# Patient Record
Sex: Male | Born: 1995 | Race: White | Hispanic: No | Marital: Single | Smoking: Former smoker
Health system: Southern US, Community
[De-identification: ages and names within clinical notes are randomized; demographics above are authoritative.]

## PROBLEM LIST (undated history)

## (undated) DIAGNOSIS — S060X9A Concussion with loss of consciousness of unspecified duration, initial encounter: Secondary | ICD-10-CM

## (undated) DIAGNOSIS — S060XAA Concussion with loss of consciousness status unknown, initial encounter: Secondary | ICD-10-CM

---
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
## 2019-12-09 ENCOUNTER — Ambulatory Visit
Admission: EM | Admit: 2019-12-09 | Discharge: 2019-12-09 | Disposition: A | Discharge status: Home / Self Care | Payer: Self-pay | Attending: Physician Assistant | Admitting: Physician Assistant

## 2019-12-09 ENCOUNTER — Other Ambulatory Visit: Payer: Self-pay

## 2019-12-09 ENCOUNTER — Encounter: Payer: Self-pay | Admitting: Emergency Medicine

## 2019-12-09 DIAGNOSIS — B349 Viral infection, unspecified: Secondary | ICD-10-CM

## 2019-12-09 DIAGNOSIS — Z87891 Personal history of nicotine dependence: Secondary | ICD-10-CM | POA: Insufficient documentation

## 2019-12-09 DIAGNOSIS — R197 Diarrhea, unspecified: Secondary | ICD-10-CM | POA: Insufficient documentation

## 2019-12-09 DIAGNOSIS — R112 Nausea with vomiting, unspecified: Secondary | ICD-10-CM | POA: Insufficient documentation

## 2019-12-09 DIAGNOSIS — Z20822 Contact with and (suspected) exposure to covid-19: Secondary | ICD-10-CM | POA: Insufficient documentation

## 2019-12-09 LAB — GROUP A STREP BY PCR: Group A Strep by PCR: NOT DETECTED

## 2019-12-09 MED ORDER — PROMETHAZINE HCL 25 MG RE SUPP: 25.0000 mg | Freq: Four times a day (QID) | RECTAL | 0 refills | Status: DC | PRN

## 2019-12-09 MED ORDER — ONDANSETRON 4 MG PO TBDP
4.0000 mg | ORAL_TABLET | Freq: Once | ORAL | Status: AC
  Administered 2019-12-09: 4 mg via ORAL

## 2019-12-09 NOTE — ED Provider Notes (Signed)
[facility]    CSN: [id number] Arrival date & time: 12/09/19  1403      History   Chief Complaint Chief Complaint  Patient presents with  . Emesis  . Diarrhea  . Sore Throat    HPI Charles Gates is a 24 y.o. male.   The history is provided by the patient. No language interpreter was used.  Emesis Severity:  Moderate Duration:  1 day Timing:  Constant Quality:  Stomach contents Able to tolerate:  Liquids Progression:  Unchanged Chronicity:  New Recent urination:  Normal Context: not post-tussive and not self-induced   Relieved by:  Nothing Worsened by:  Nothing Ineffective treatments:  None tried Associated symptoms: myalgias and sore throat   Associated symptoms: no abdominal pain, no fever and no headaches   Risk factors: no alcohol use, no diabetes, not pregnant, no prior abdominal surgery, no sick contacts, no suspect food intake and no travel to endemic areas     History reviewed. No pertinent past medical history.  Patient Active Problem List   Diagnosis Date Noted  . Nausea vomiting and diarrhea 12/09/2019  . Nonspecific syndrome suggestive of viral illness 12/09/2019    Past Surgical History:  Procedure Laterality Date  . WISDOM TOOTH EXTRACTION         Home Medications    Prior to Admission medications   Medication Sig Start Date End Date Taking? Authorizing Provider  promethazine (PHENERGAN) 25 MG suppository Place 1 suppository (25 mg total) rectally every 6 (six) hours as needed for nausea or vomiting. 12/09/19   Yulonda Wheeling, Para March, NP    Family History Family History  Problem Relation Age of Onset  . Healthy Mother   . Lung cancer Father     Social History Social History   Tobacco Use  . Smoking status: Former Smoker    Quit date: 12/09/2018    Years since quitting: 1.0  . Smokeless tobacco: Former Neurosurgeon    Quit date: 12/09/2018  Vaping Use  . Vaping Use: Never used  Substance Use Topics  . Alcohol use: Yes     Comment: rare  . Drug use: Never     Allergies   Gluten meal   Review of Systems Review of Systems  Constitutional: Positive for appetite change. Negative for fever.  HENT: Positive for sore throat.   Eyes: Negative.   Respiratory: Negative for shortness of breath.   Cardiovascular: Negative for chest pain.  Gastrointestinal: Negative for abdominal pain.  Endocrine: Negative.   Genitourinary: Negative.   Musculoskeletal: Positive for myalgias.  Skin: Negative for rash.  Allergic/Immunologic: Negative.   Neurological: Negative for headaches.  Hematological: Negative.   Psychiatric/Behavioral: Negative.   All other systems reviewed and are negative.    Physical Exam Triage Vital Signs ED Triage Vitals  Enc Vitals Group     BP 12/09/19 1438 111/77     Pulse Rate 12/09/19 1438 83     Resp 12/09/19 1438 18     Temp 12/09/19 1438 98.6 F (37 C)     Temp Source 12/09/19 1438 Oral     SpO2 12/09/19 1438 99 %     Weight --      Height --      Head Circumference --      Peak Flow --      Pain Score 12/09/19 1437 5     Pain Loc --      Pain Edu? --      Excl. in GC? --  No data found.  Updated Vital Signs BP 111/77 (BP Location: Left Arm)   Pulse 83   Temp 98.6 F (37 C) (Oral)   Resp 18   SpO2 99%    Physical Exam Vitals and nursing note reviewed.  Constitutional:      General: He is not in acute distress.    Appearance: He is well-developed. He is not ill-appearing.  HENT:     Head: Normocephalic.     Jaw: No trismus.     Right Ear: Tympanic membrane normal. No hemotympanum.     Left Ear: Tympanic membrane normal. No hemotympanum.     Nose: Mucosal edema and congestion present. No nasal deformity or septal deviation.     Mouth/Throat:     Mouth: Mucous membranes are moist.     Pharynx: Uvula midline. Posterior oropharyngeal erythema present. No oropharyngeal exudate or uvula swelling.     Tonsils: No tonsillar abscesses.  Eyes:     General: Lids  are normal.        Right eye: No discharge.        Left eye: No discharge.     Conjunctiva/sclera: Conjunctivae normal.     Right eye: Right conjunctiva is not injected. No hemorrhage.    Left eye: Left conjunctiva is not injected. No hemorrhage.    Pupils: Pupils are equal, round, and reactive to light. Pupils are equal.     Right eye: Pupil is round and reactive.     Left eye: Pupil is round and reactive.  Neck:     Trachea: Trachea normal. No tracheal deviation.     Meningeal: Brudzinski's sign and Kernig's sign absent.  Cardiovascular:     Rate and Rhythm: Normal rate and regular rhythm.     Pulses: Normal pulses.     Heart sounds: Normal heart sounds.  Pulmonary:     Effort: Pulmonary effort is normal.     Breath sounds: Normal breath sounds.  Abdominal:     General: Bowel sounds are normal.     Palpations: Abdomen is soft.     Tenderness: There is no abdominal tenderness.  Musculoskeletal:        General: Normal range of motion.     Cervical back: Normal range of motion.  Lymphadenopathy:     Cervical: No cervical adenopathy.  Skin:    General: Skin is warm and dry.  Neurological:     General: No focal deficit present.     Mental Status: He is alert and oriented to person, place, and time.     GCS: GCS eye subscore is 4. GCS verbal subscore is 5. GCS motor subscore is 6.     Cranial Nerves: No cranial nerve deficit.     Sensory: No sensory deficit.  Psychiatric:        Attention and Perception: Attention normal.        Mood and Affect: Mood normal.        Speech: Speech normal.        Behavior: Behavior is cooperative.      UC Treatments / Results  Labs (all labs ordered are listed, but only abnormal results are displayed) Labs Reviewed  GROUP A STREP BY PCR  SARS CORONAVIRUS 2 (TAT 6-24 HRS)    EKG   Radiology No results found.  Procedures Procedures (including critical care time)  Medications Ordered in UC Medications  ondansetron (ZOFRAN-ODT)  disintegrating tablet 4 mg (4 mg Oral Given 12/09/19 1454)    Initial Impression /  Assessment and Plan / UC Course  I have reviewed the triage vital signs and the nursing notes.  Pertinent labs & imaging results that were available during my care of the patient were reviewed by me and considered in my medical decision making (see chart for details).     Strep test negative, covid pending, discussed quarantine precautions w pt regarding covid. Able to take po's, recommend pushing fluids, rest. Will script phenergan suppositories. Work note given. Most likely this is viral illness no abx indicated.  DDX: gastroenteritis, viral illness, possible covid expsoure. Final Clinical Impressions(s) / UC Diagnoses   Final diagnoses:  Nausea vomiting and diarrhea  Nonspecific syndrome suggestive of viral illness     Discharge Instructions     Rest,push fluids, take phenergan as directed. Your covid test is pending, quarantine until resulted.     ED Prescriptions    Medication Sig Dispense Auth. Provider   promethazine (PHENERGAN) 25 MG suppository Place 1 suppository (25 mg total) rectally every 6 (six) hours as needed for nausea or vomiting. 12 each Caia Lofaro, Para March, NP     PDMP not reviewed this encounter.   Clancy Gourd, NP 12/09/19 [phone]

## 2019-12-09 NOTE — ED Triage Notes (Signed)
Patient in today c/o emesis, diarrhea, sore throat and headache since yesterday. Patient took an at home covid test yesterday and it was negative. Patient has not had the covid vaccine.

## 2019-12-09 NOTE — Discharge Instructions (Addendum)
Rest,push fluids, take phenergan as directed. Your covid test is pending, quarantine until resulted.

## 2019-12-10 LAB — SARS CORONAVIRUS 2 (TAT 6-24 HRS): SARS Coronavirus 2: NEGATIVE

## 2019-12-22 ENCOUNTER — Ambulatory Visit
Admission: RE | Admit: 2019-12-22 | Discharge: 2019-12-22 | Disposition: A | Discharge status: Home / Self Care | Payer: No Typology Code available for payment source | Attending: Physician Assistant | Admitting: Physician Assistant

## 2019-12-22 ENCOUNTER — Other Ambulatory Visit: Payer: Self-pay | Admitting: Physician Assistant

## 2019-12-22 ENCOUNTER — Ambulatory Visit
Admission: RE | Admit: 2019-12-22 | Discharge: 2019-12-22 | Disposition: A | Discharge status: Home / Self Care | Payer: No Typology Code available for payment source | Source: Ambulatory Visit | Attending: Physician Assistant | Admitting: Physician Assistant

## 2019-12-22 DIAGNOSIS — S4991XA Unspecified injury of right shoulder and upper arm, initial encounter: Secondary | ICD-10-CM | POA: Diagnosis not present

## 2020-04-12 ENCOUNTER — Ambulatory Visit
Admission: EM | Admit: 2020-04-12 | Discharge: 2020-04-12 | Disposition: A | Discharge status: Home / Self Care | Payer: Medicaid Other

## 2020-04-12 ENCOUNTER — Other Ambulatory Visit: Payer: Self-pay

## 2020-04-12 ENCOUNTER — Encounter: Payer: Self-pay | Admitting: Emergency Medicine

## 2020-04-12 DIAGNOSIS — G44319 Acute post-traumatic headache, not intractable: Secondary | ICD-10-CM

## 2020-04-12 DIAGNOSIS — R112 Nausea with vomiting, unspecified: Secondary | ICD-10-CM

## 2020-04-12 DIAGNOSIS — S069X9A Unspecified intracranial injury with loss of consciousness of unspecified duration, initial encounter: Secondary | ICD-10-CM

## 2020-04-12 DIAGNOSIS — H539 Unspecified visual disturbance: Secondary | ICD-10-CM

## 2020-04-12 HISTORY — DX: Concussion with loss of consciousness of unspecified duration, initial encounter: S06.0X9A

## 2020-04-12 HISTORY — DX: Concussion with loss of consciousness status unknown, initial encounter: S06.0XAA

## 2020-04-12 NOTE — Discharge Instructions (Addendum)
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
## 2020-04-19 ENCOUNTER — Ambulatory Visit
Admission: EM | Admit: 2020-04-19 | Discharge: 2020-04-19 | Disposition: A | Discharge status: Home / Self Care | Payer: Medicaid Other

## 2020-04-19 ENCOUNTER — Other Ambulatory Visit: Payer: Self-pay

## 2020-04-19 DIAGNOSIS — S0990XD Unspecified injury of head, subsequent encounter: Secondary | ICD-10-CM

## 2020-04-19 NOTE — Discharge Instructions (Addendum)
Your physical exam and neurological exam today were completely normal.  You are cleared to return to work.

## 2020-04-19 NOTE — ED Provider Notes (Signed)
[facility]    CSN: [id number] Arrival date & time: 04/19/20  1041      History   Chief Complaint Chief Complaint  Patient presents with  . Head Injury    HPI Charles Gates is a 25 y.o. male.   HPI   25 year old male here for concussion clearance to return to work.  Patient was evaluated in this urgent care on 04/12/2020 for a head injury with loss of consciousness and referred to the emergency department.  Patient was evaluated in the emergency department, had a head CT scan, and was discharged home.  His head CT showed no acute intracranial abnormality.  Patient reports that he is no gravity headaches, no vision changes, no numbness, tingling, or weakness, and that he feels ready to return to work.  Past Medical History:  Diagnosis Date  . Concussion     Patient Active Problem List   Diagnosis Date Noted  . Nausea vomiting and diarrhea 12/09/2019  . Nonspecific syndrome suggestive of viral illness 12/09/2019    Past Surgical History:  Procedure Laterality Date  . WISDOM TOOTH EXTRACTION         Home Medications    Prior to Admission medications   Medication Sig Start Date End Date Taking? Authorizing Provider  promethazine (PHENERGAN) 25 MG suppository Place 1 suppository (25 mg total) rectally every 6 (six) hours as needed for nausea or vomiting. 12/09/19 04/19/20  Defelice, Para March, NP    Family History Family History  Problem Relation Age of Onset  . Healthy Mother   . Lung cancer Father     Social History Social History   Tobacco Use  . Smoking status: Former Smoker    Quit date: 12/09/2018    Years since quitting: 1.3  . Smokeless tobacco: Former Neurosurgeon    Quit date: 12/09/2018  Vaping Use  . Vaping Use: Never used  Substance Use Topics  . Alcohol use: Yes    Comment: rare  . Drug use: Never     Allergies   Gluten meal   Review of Systems Review of Systems  Constitutional: Negative for activity change, appetite change  and fatigue.  Neurological: Negative for dizziness, weakness and headaches.  Hematological: Negative.   Psychiatric/Behavioral: Negative.      Physical Exam Triage Vital Signs ED Triage Vitals  Enc Vitals Group     BP 04/19/20 1107 132/86     Pulse Rate 04/19/20 1107 76     Resp 04/19/20 1107 18     Temp 04/19/20 1107 98.4 F (36.9 C)     Temp Source 04/19/20 1107 Oral     SpO2 04/19/20 1107 98 %     Weight 04/19/20 1106 195 lb 1.7 oz (88.5 kg)     Height 04/19/20 1106 5\' 10"  (1.778 m)     Head Circumference --      Peak Flow --      Pain Score 04/19/20 1106 0     Pain Loc --      Pain Edu? --      Excl. in GC? --    No data found.  Updated Vital Signs BP 132/86 (BP Location: Right Arm)   Pulse 76   Temp 98.4 F (36.9 C) (Oral)   Resp 18   Ht 5\' 10"  (1.778 m)   Wt 195 lb 1.7 oz (88.5 kg)   SpO2 98%   BMI 27.99 kg/m   Visual Acuity Right Eye Distance:   Left Eye Distance:  Bilateral Distance:    Right Eye Near:   Left Eye Near:    Bilateral Near:     Physical Exam Vitals and nursing note reviewed.  Constitutional:      General: He is not in acute distress.    Appearance: Normal appearance. He is not ill-appearing.  HENT:     Head: Normocephalic and atraumatic.     Mouth/Throat:     Mouth: Mucous membranes are moist.     Pharynx: Oropharynx is clear. No oropharyngeal exudate or posterior oropharyngeal erythema.  Eyes:     General: No scleral icterus.    Extraocular Movements: Extraocular movements intact.     Conjunctiva/sclera: Conjunctivae normal.     Pupils: Pupils are equal, round, and reactive to light.  Cardiovascular:     Rate and Rhythm: Normal rate and regular rhythm.     Pulses: Normal pulses.     Heart sounds: No murmur heard. No gallop.   Pulmonary:     Effort: Pulmonary effort is normal.     Breath sounds: Normal breath sounds. No wheezing, rhonchi or rales.  Musculoskeletal:        General: No swelling or tenderness. Normal range  of motion.     Cervical back: Normal range of motion and neck supple.  Lymphadenopathy:     Cervical: No cervical adenopathy.  Skin:    General: Skin is warm and dry.     Capillary Refill: Capillary refill takes less than 2 seconds.     Findings: No erythema or rash.  Neurological:     General: No focal deficit present.     Mental Status: He is alert and oriented to person, place, and time.     Cranial Nerves: No cranial nerve deficit.     Sensory: No sensory deficit.     Coordination: Coordination normal.     Deep Tendon Reflexes: Reflexes normal.  Psychiatric:        Mood and Affect: Mood normal.        Behavior: Behavior normal.        Thought Content: Thought content normal.        Judgment: Judgment normal.      UC Treatments / Results  Labs (all labs ordered are listed, but only abnormal results are displayed) Labs Reviewed - No data to display  EKG   Radiology No results found.  Procedures Procedures (including critical care time)  Medications Ordered in UC Medications - No data to display  Initial Impression / Assessment and Plan / UC Course  I have reviewed the triage vital signs and the nursing notes.  Pertinent labs & imaging results that were available during my care of the patient were reviewed by me and considered in my medical decision making (see chart for details).   Is here for evaluation and clearance to return to work status post concussion.  Patient is in no acute distress.  Physical exam reveals no abnormalities.  Neuro exam reveals intact cranial nerves II through XII, DTRs are globally 1+, grips, upper extremities, and lower extremity strength are all 5/5.  Patient has no lingering neurological symptoms from his concussion.  Will clear patient to return to work.   Final Clinical Impressions(s) / UC Diagnoses   Final diagnoses:  Closed head injury, subsequent encounter     Discharge Instructions     Your physical exam and neurological  exam today were completely normal.  You are cleared to return to work.    ED Prescriptions  None     PDMP not reviewed this encounter.   Becky Augusta, NP 04/19/20 1130
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
## 2020-12-07 IMAGING — CR DG SHOULDER 2+V*R*
1 series · 4 of 4 positions shown · non-contrast
Comparison: None.

CLINICAL DATA: 23-year-old male with right shoulder injury.

EXAM:
RIGHT SHOULDER - 2+ VIEW

[Series 1: dg shoulder right · 0.14mm/px · 4 of 4 slices shown]
[im 1/4]
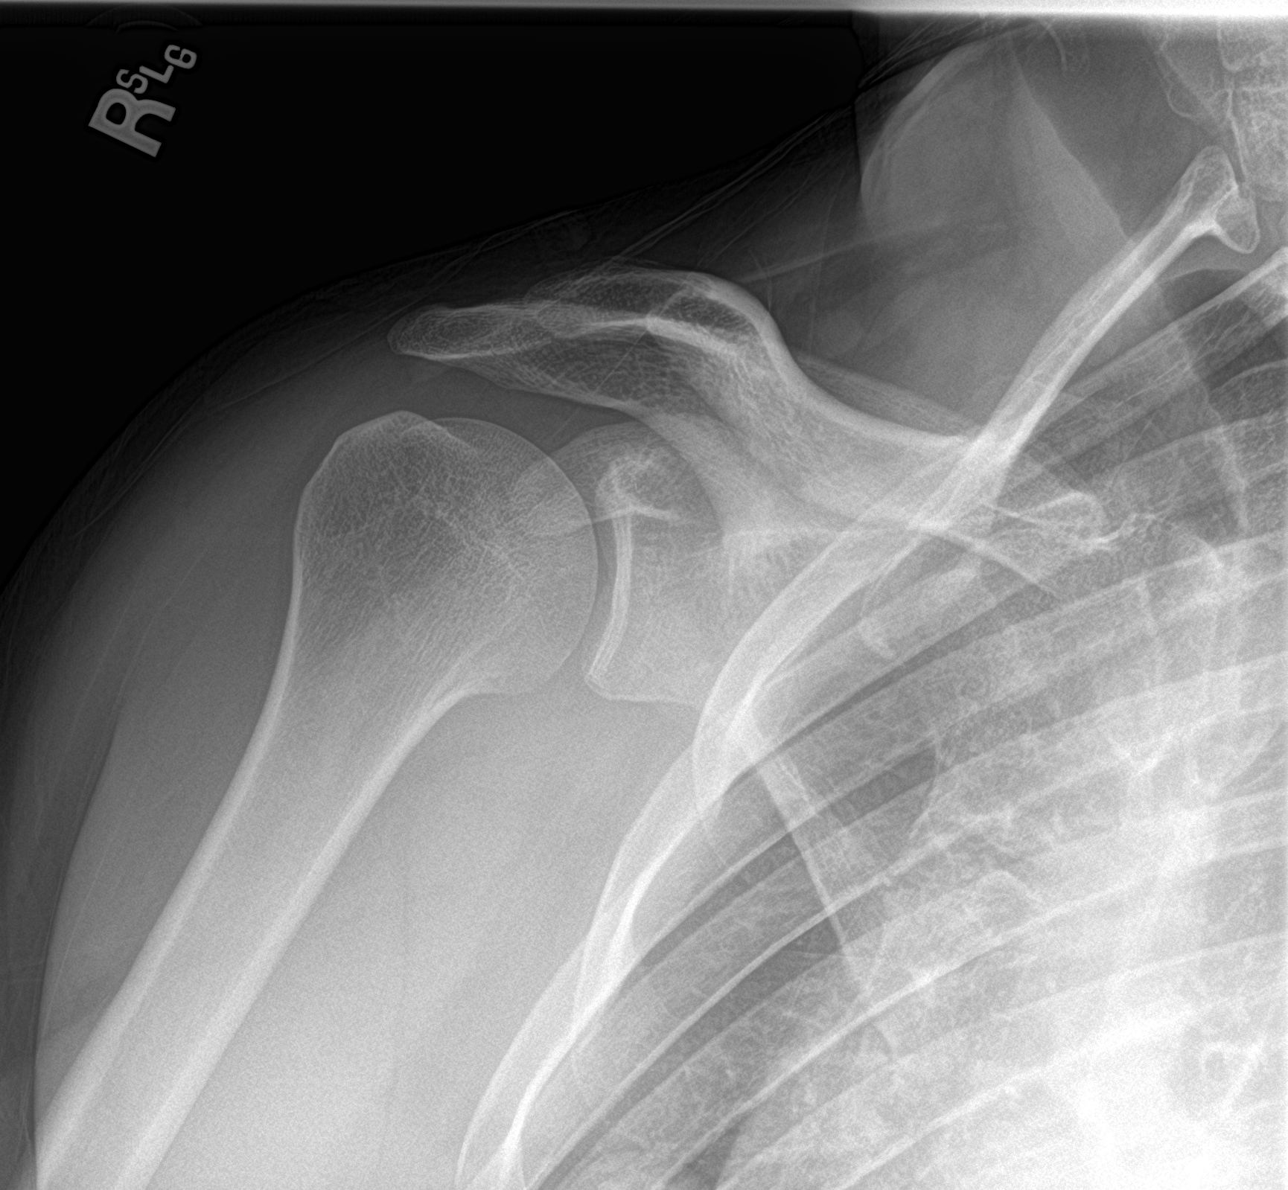
[im 2/4]
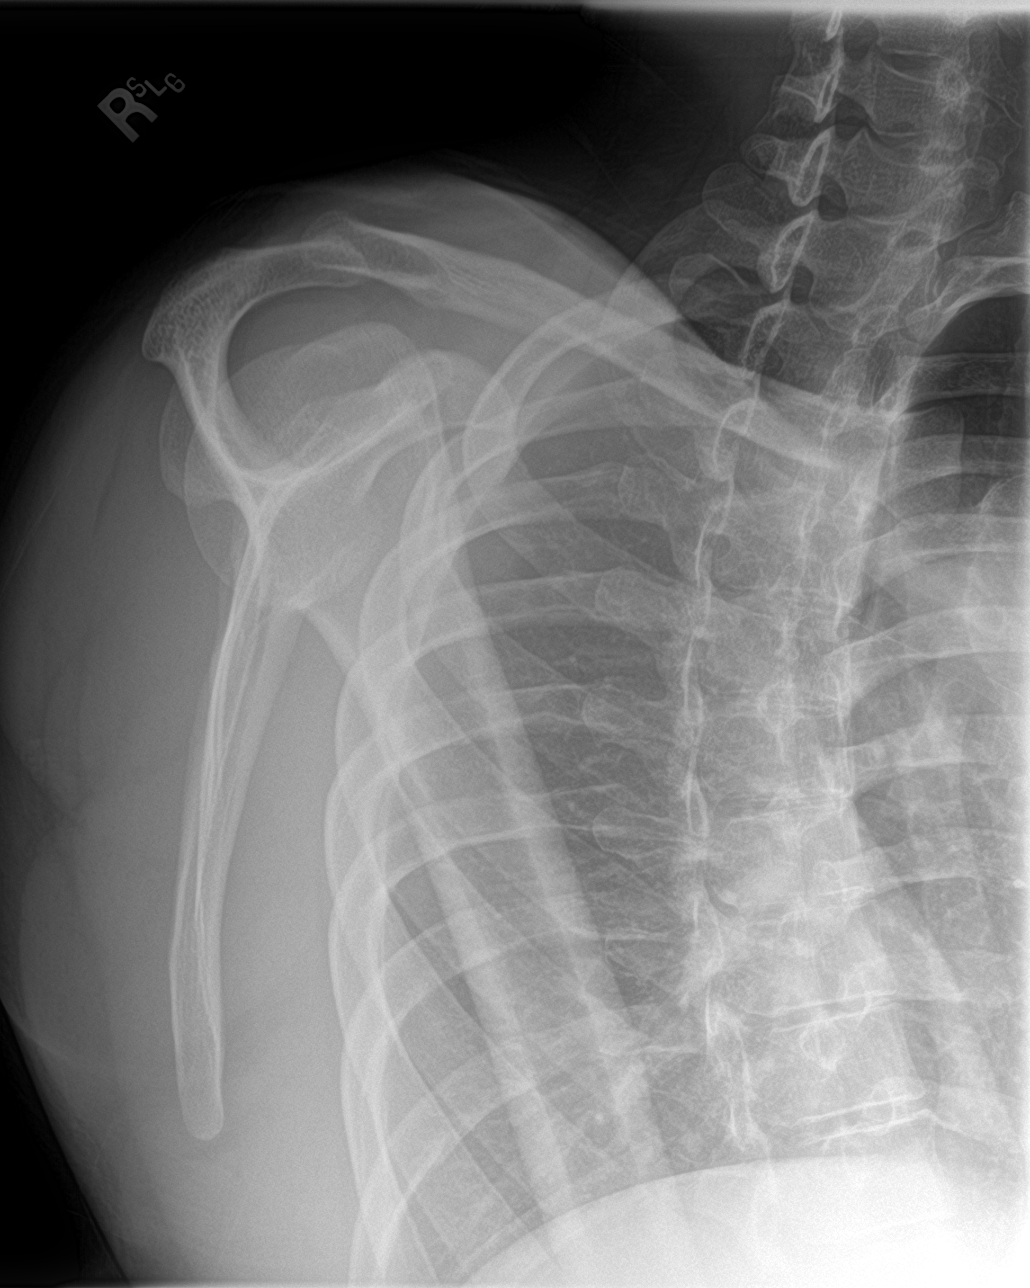
[im 3/4]
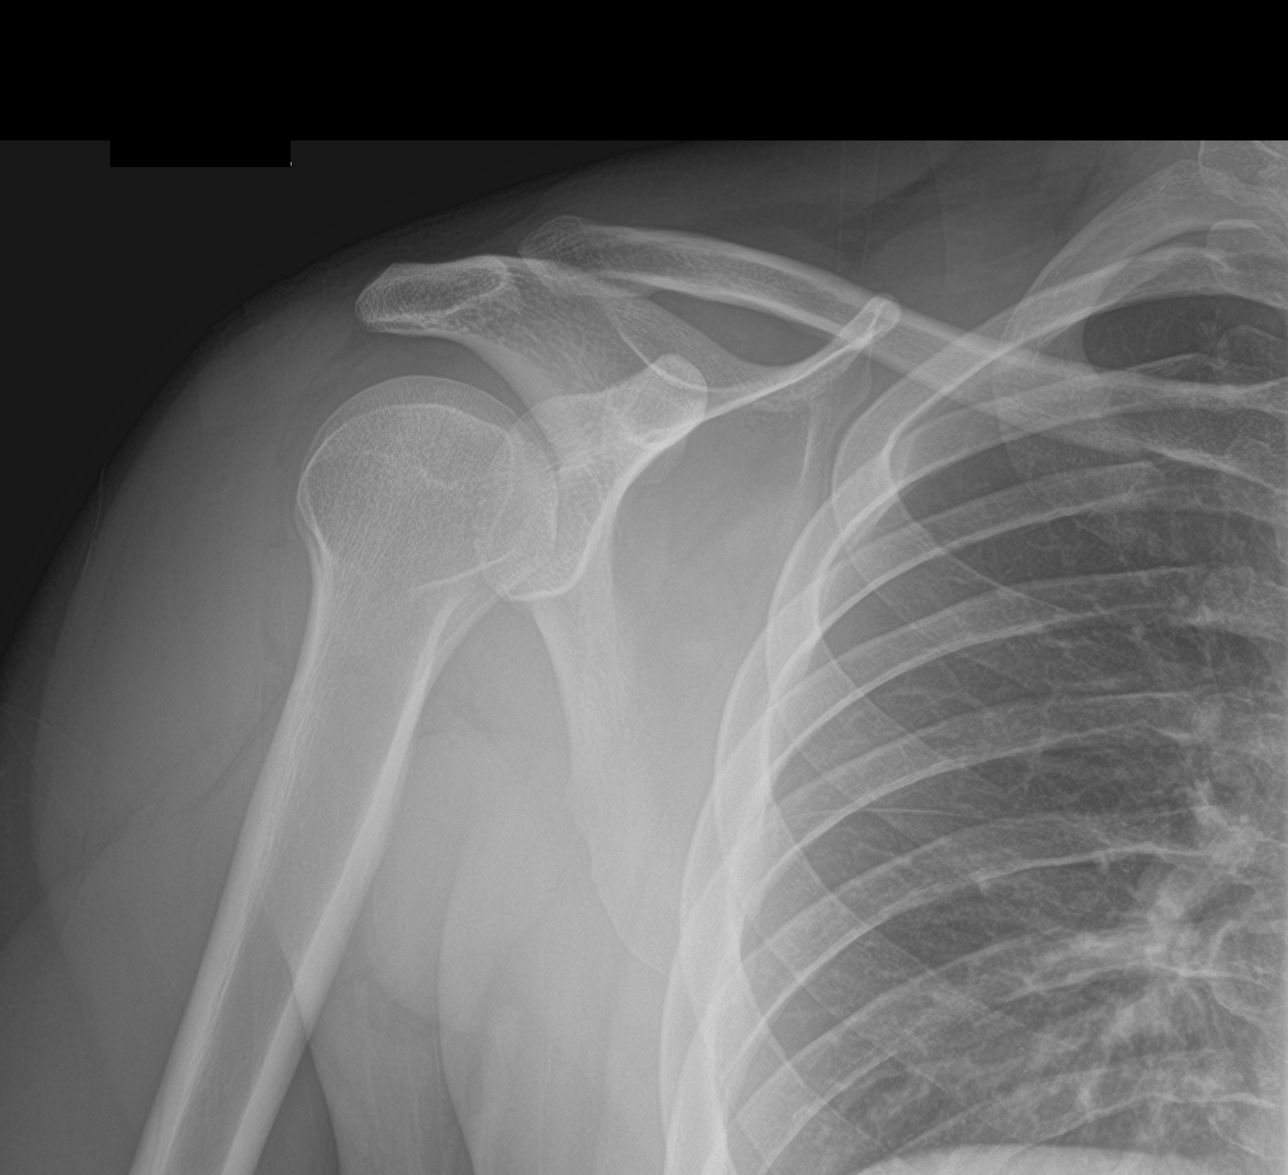
[im 4/4]
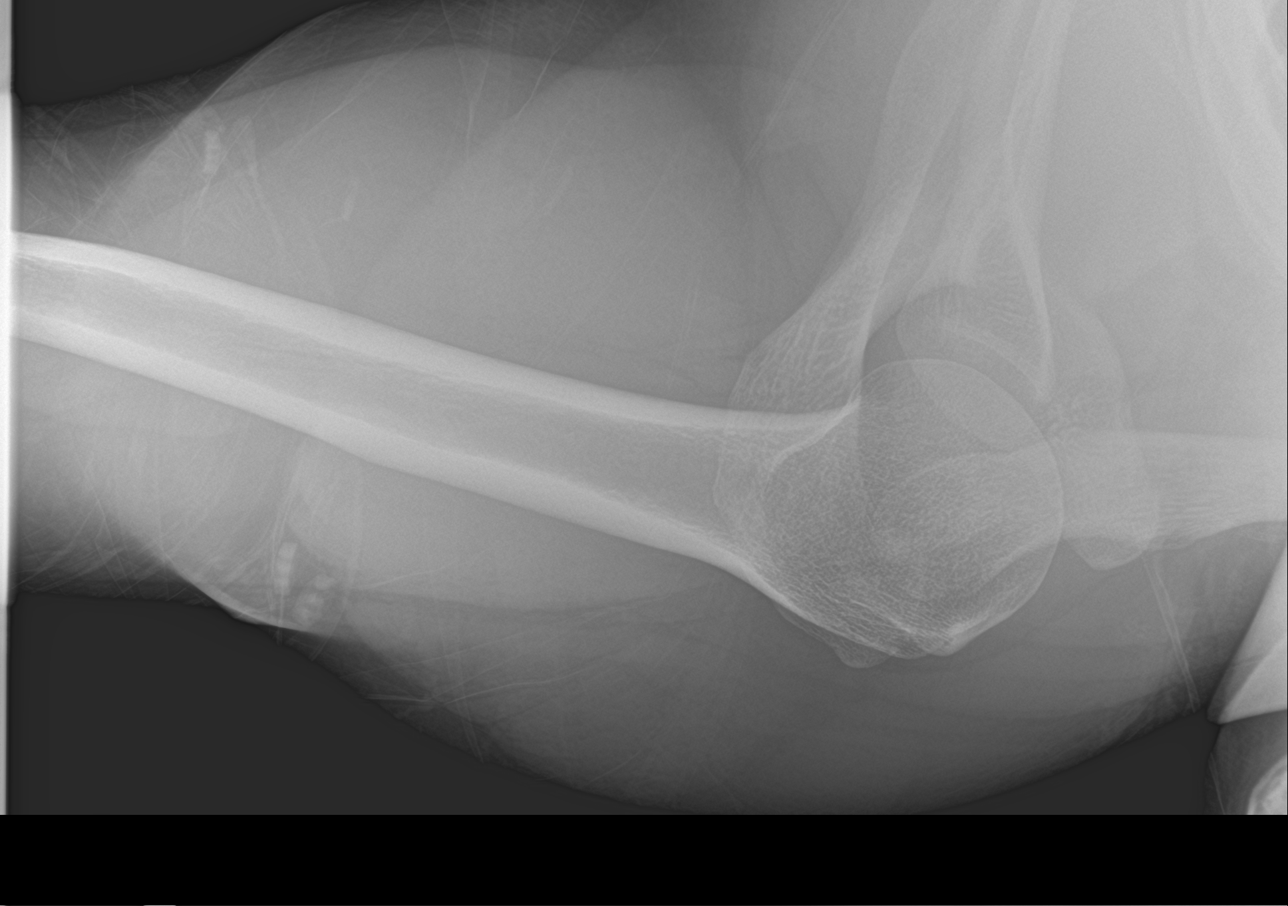

[4 of 4 positions shown; findings below may reference images not displayed]

FINDINGS: There is no evidence of fracture or dislocation. There is no
evidence of arthropathy or other focal bone abnormality. Soft
tissues are unremarkable.
IMPRESSION: Negative.
